# Patient Record
Sex: Female | Born: 2000 | Race: Black or African American | Hispanic: No | Marital: Single | Smoking: Never smoker
Health system: Southern US, Community
[De-identification: ages and names within clinical notes are randomized; demographics above are authoritative.]

---
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
## 2021-02-19 ENCOUNTER — Inpatient Hospital Stay (HOSPITAL_COMMUNITY)
Admission: AD | Admit: 2021-02-19 | Discharge: 2021-02-19 | Disposition: A | Discharge status: Home / Self Care | Payer: BC Managed Care – PPO | Attending: Family Medicine | Admitting: Family Medicine

## 2021-02-19 ENCOUNTER — Other Ambulatory Visit: Payer: Self-pay

## 2021-02-19 DIAGNOSIS — N926 Irregular menstruation, unspecified: Secondary | ICD-10-CM | POA: Insufficient documentation

## 2021-02-19 NOTE — MAU Note (Signed)
Is 3 days late for her cycle, did a pee test the first day. Was neg, spotted once that day.  Her godmother told her to wait a wk.  Her mother told her to come up here and get a blood test. Has never been late before. No bleeding. Has had cramps here and there- none this morning.

## 2021-02-19 NOTE — MAU Provider Note (Signed)
Event Date/Time   First Provider Initiated Contact with Patient 02/19/21 1145      S Ms. Shelby Fox is a 20 y.o. No obstetric history on file. patient who presents to MAU today with complaint of missed period. No pain or bleeding. Took home UPT, neg.   O BP 116/67 (BP Location: Right Arm)   Pulse 86   Temp 99.1 F (37.3 C) (Oral)   Resp 16   Ht 5\' 3"  (1.6 m)   Wt 52.3 kg   LMP 01/18/2021   SpO2 100%   BMI 20.44 kg/m  Physical Exam Vitals reviewed.  Constitutional:      Appearance: Normal appearance.  Abdominal:     General: Abdomen is flat.     Palpations: Abdomen is soft.  Skin:    General: Skin is warm.     Capillary Refill: Capillary refill takes less than 2 seconds.  Neurological:     General: No focal deficit present.     Mental Status: She is alert.  Psychiatric:        Mood and Affect: Mood normal.        Behavior: Behavior normal.        Thought Content: Thought content normal.        Judgment: Judgment normal.    A Medical screening exam complete   ICD-10-CM   1. Late menses  N92.6     P Discharge from MAU in stable condition Patient given the option of transfer to [facility] for further evaluation or seek care in outpatient facility of choice Patient may return to MAU as needed   [facility], DO 02/19/2021 11:52 AM

## 2021-07-13 IMAGING — US US PELVIS COMPLETE WITH TRANSVAGINAL
1 series · 14 of 25 positions shown · non-contrast
Comparison: None

CLINICAL DATA: Pelvic pain for 9 months.

EXAM:
TRANSABDOMINAL AND TRANSVAGINAL ULTRASOUND OF PELVIS
TECHNIQUE: Both transabdominal and transvaginal ultrasound examinations of the
pelvis were performed. Transabdominal technique was performed for
global imaging of the pelvis including uterus, ovaries, adnexal
regions, and pelvic cul-de-sac. It was necessary to proceed with
endovaginal exam following the transabdominal exam to visualize the
endometrium and ovaries.

[Series 1: us pelvis complete with transvaginal · 0.11mm/px · 97 acquisitions, 14 frames shown]
[im 1/97]
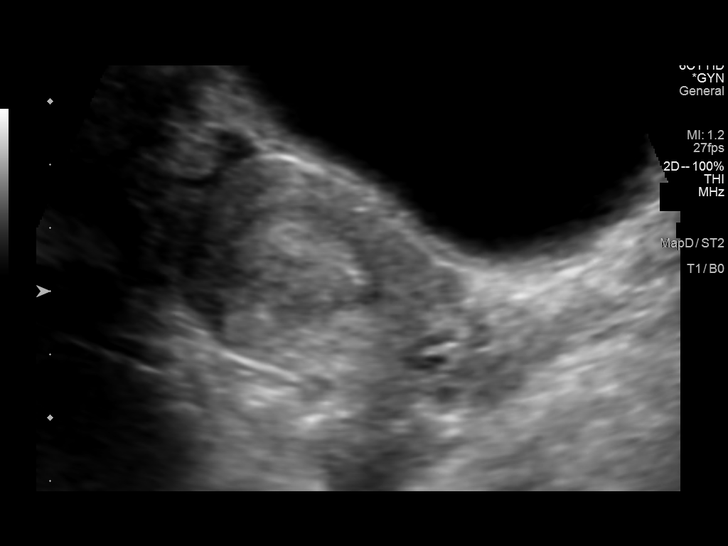
[im 9/97]
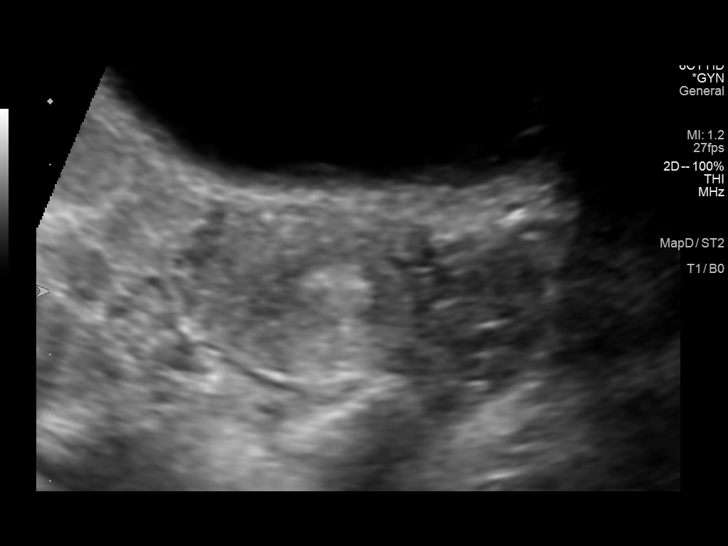
[im 17/97]
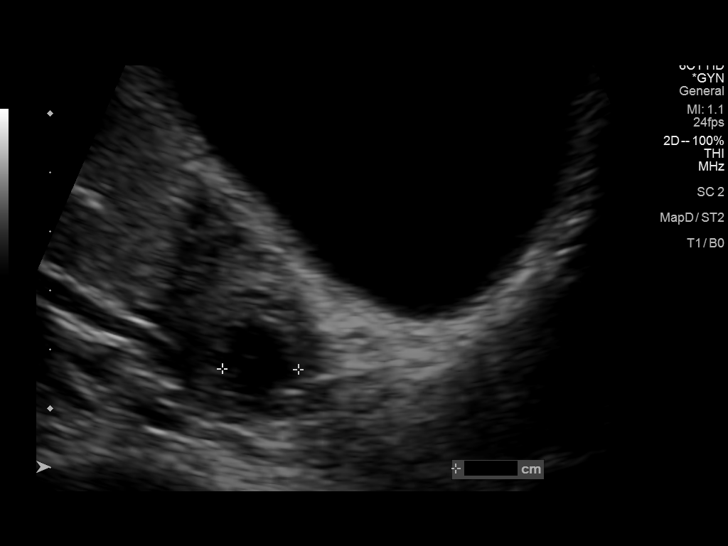
[im 25/97]
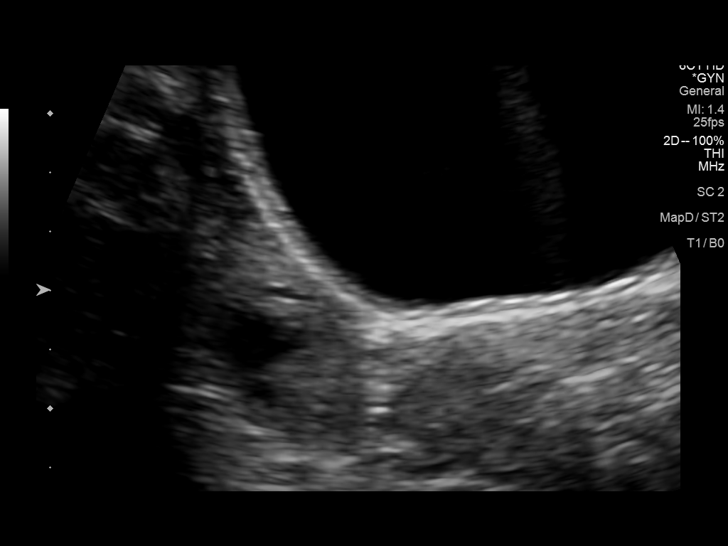
[im 33/97]
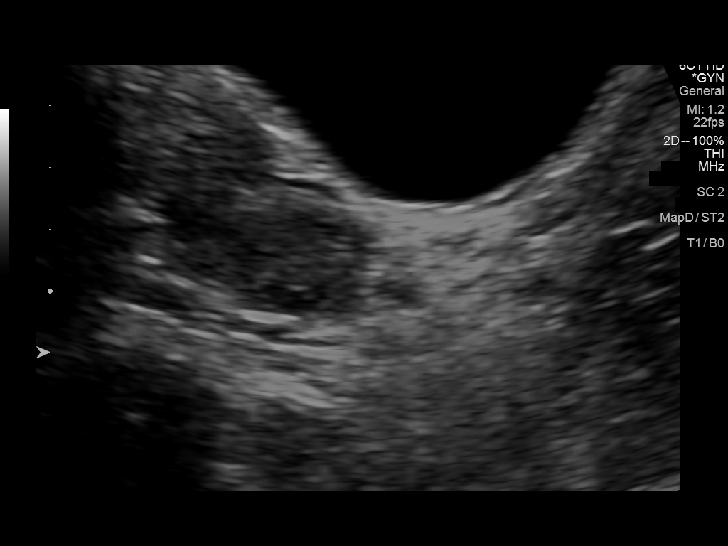
[im 37/97]
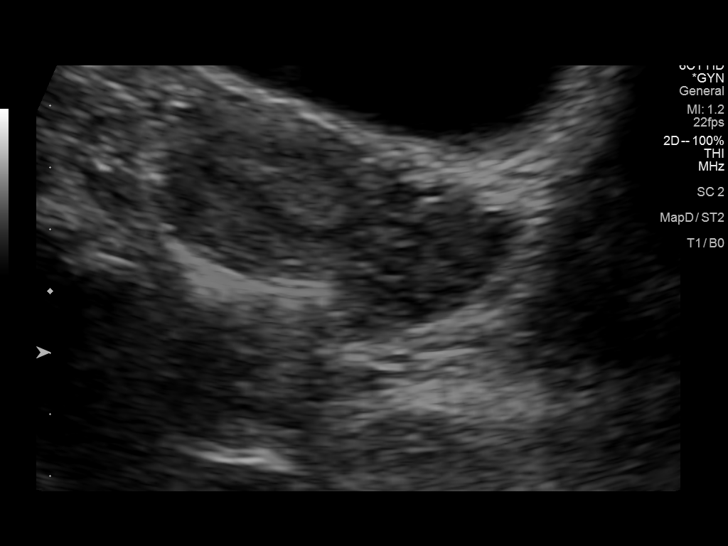
[im 45/97]
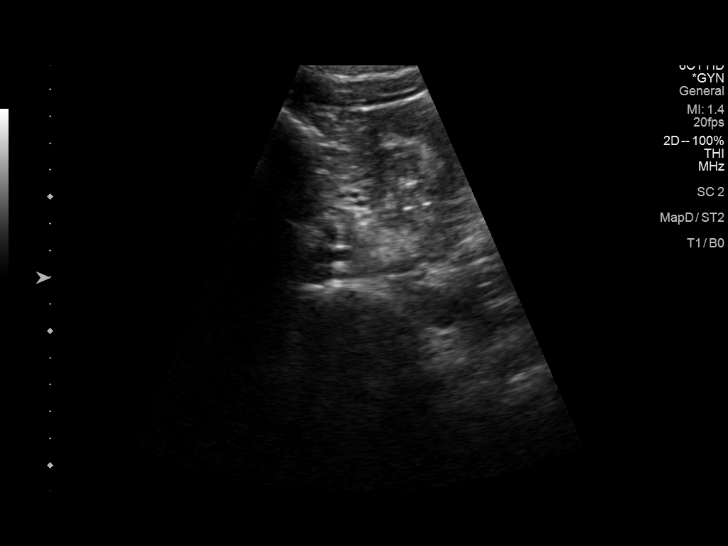
[im 53/97]
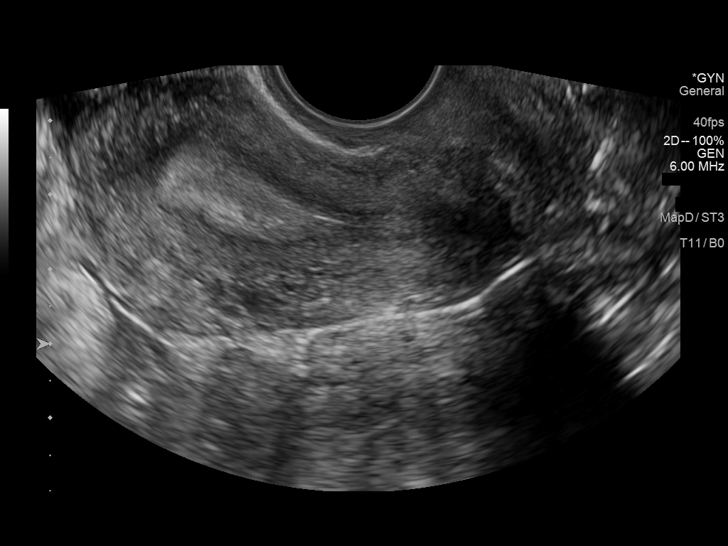
[im 61/97]
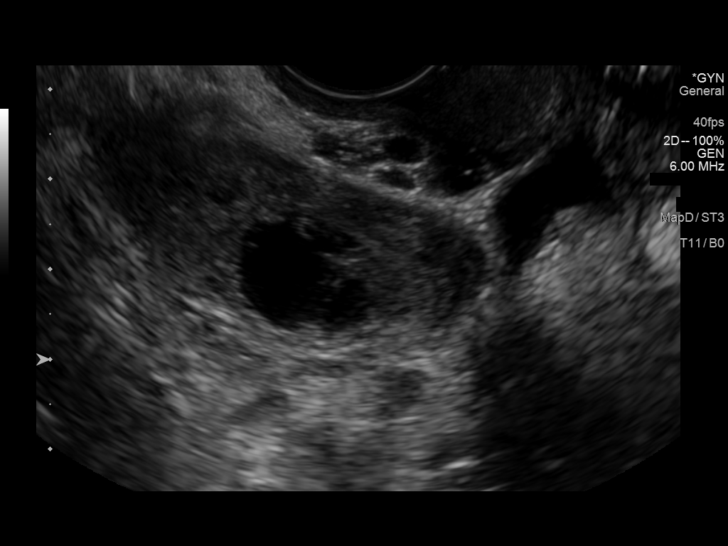
[im 65/97]
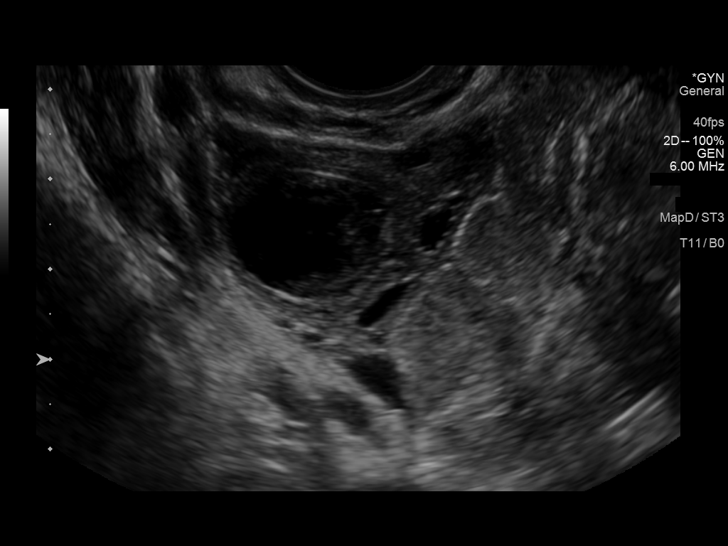
[im 73/97]
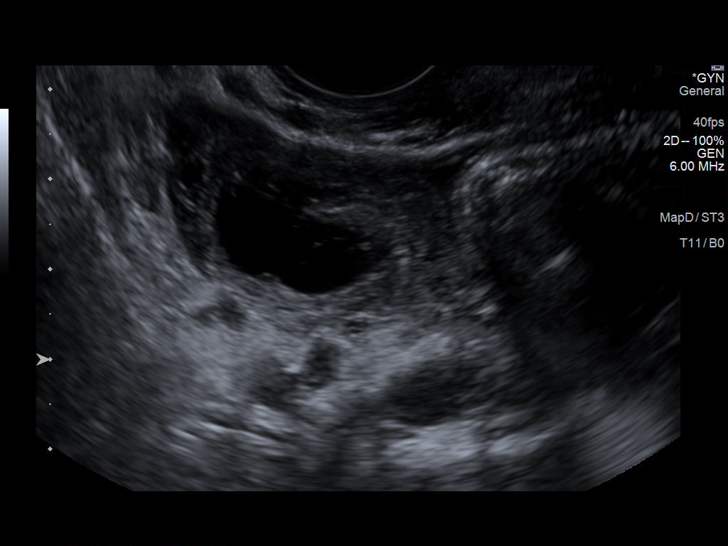
[im 81/97]
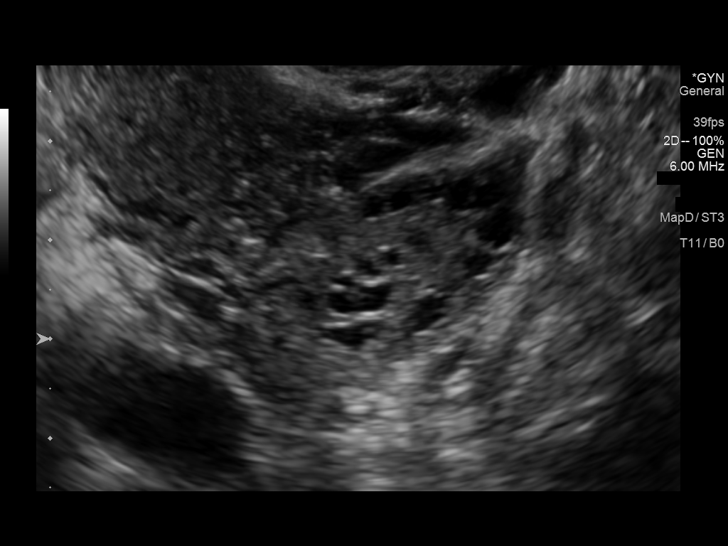
[im 89/97]
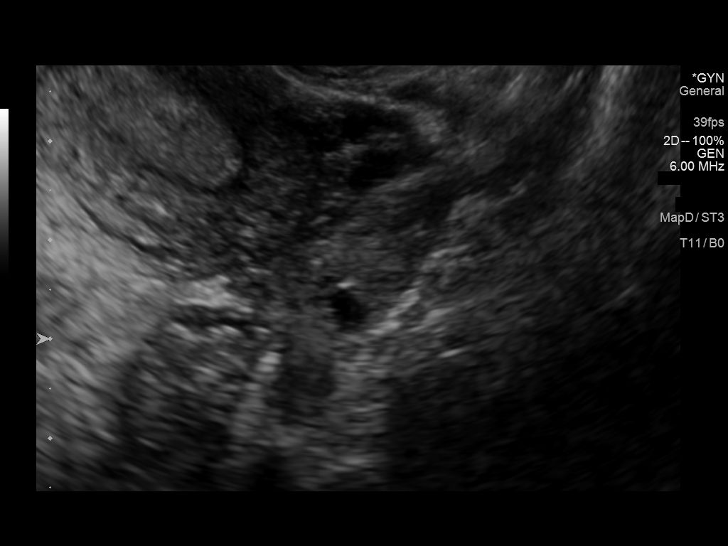
[im 97/97]
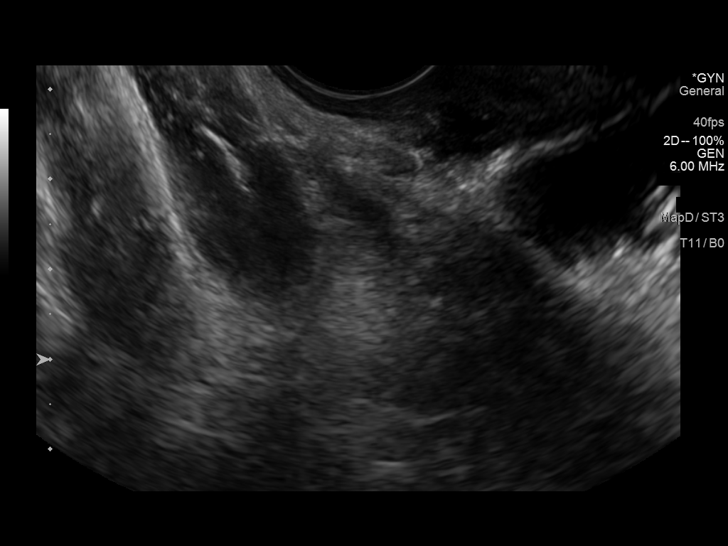

[14 of 25 positions shown; findings below may reference images not displayed]

FINDINGS: Uterus

Measurements: 7.4 x 3.5 x 4.5 cm = volume: 61 mL. No fibroids or
other mass visualized.

Endometrium

Thickness: 14 mm.  No focal abnormality visualized.

Right ovary

Measurements: 4.2 x 2.3 x 2.1 cm = volume: 10.9 cm mL. 1.8 cm
involuting corpus luteum cyst noted. No masses identified.

Left ovary

Measurements: 3.7 x 1.9 x 1.8 cm = volume: 6.2 mL. Normal
appearance/no adnexal mass.

Other findings

No abnormal free fluid.
IMPRESSION: Negative. No pelvic mass or other significant abnormality
identified.
